# Patient Record
Sex: Female | Born: 1980 | Race: White | Hispanic: No | Marital: Married
Health system: Southern US, Community
[De-identification: ages and names within clinical notes are randomized; demographics above are authoritative.]

---

## 2006-05-26 ENCOUNTER — Other Ambulatory Visit: Admission: RE | Admit: 2006-05-26 | Discharge: 2006-05-26 | Payer: Self-pay | Admitting: Gynecology

## 2009-09-18 ENCOUNTER — Inpatient Hospital Stay (HOSPITAL_COMMUNITY): Admission: AD | Admit: 2009-09-18 | Discharge: 2009-09-22 | Payer: Self-pay | Admitting: Obstetrics and Gynecology

## 2009-09-19 ENCOUNTER — Encounter: Payer: Self-pay | Admitting: Obstetrics and Gynecology

## 2009-09-29 ENCOUNTER — Inpatient Hospital Stay (HOSPITAL_COMMUNITY): Admission: AD | Admit: 2009-09-29 | Discharge: 2009-10-02 | Payer: Self-pay | Admitting: Obstetrics & Gynecology

## 2010-09-17 IMAGING — US US OB LIMITED
1 series · 14 of 20 positions shown · non-contrast
Comparison: none

OBSTETRICAL ULTRASOUND:
 This ultrasound was performed in The [HOSPITAL], and the [facility] report will be stored to [REDACTED] PACS.  This report is also available in [HOSPITAL]?s accessANYware.

[Series 1: us ob limited · 14 of 20 slices shown]
[im 1/20]
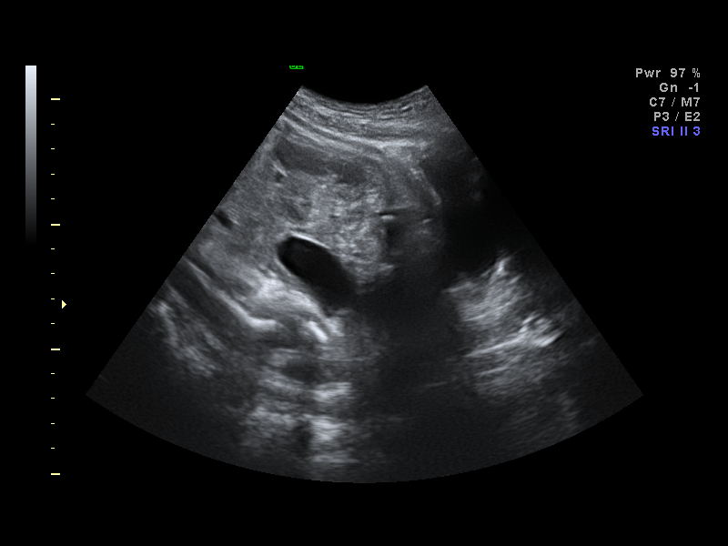
[im 3/20]
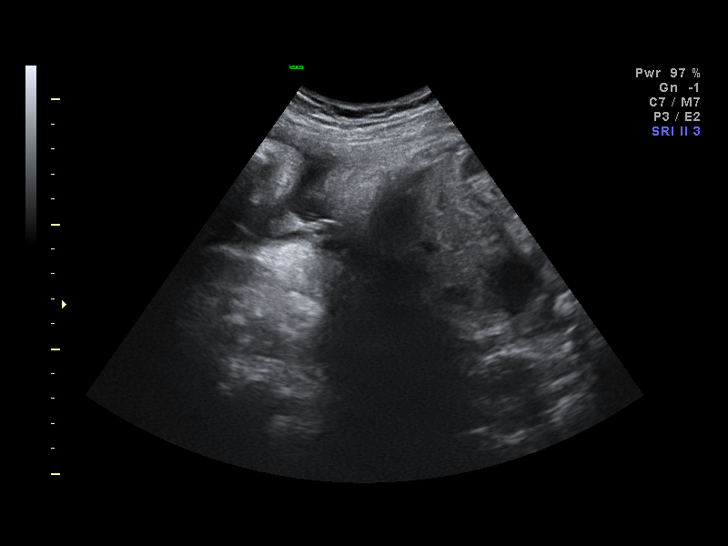
[im 4/20]
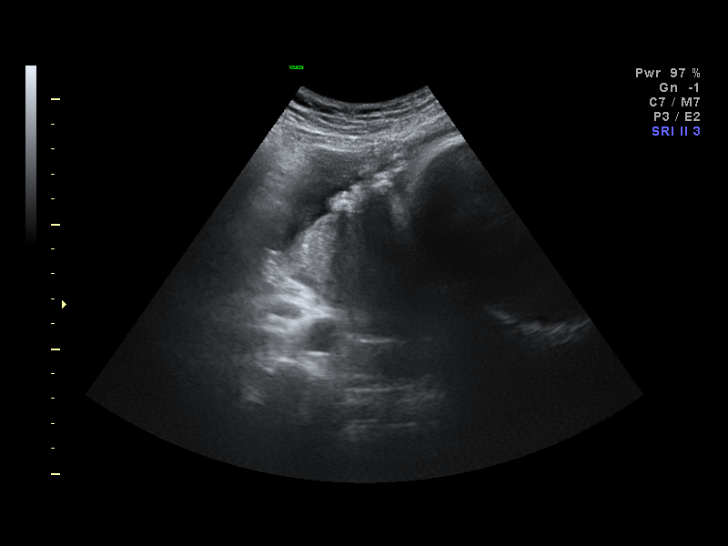
[im 6/20]
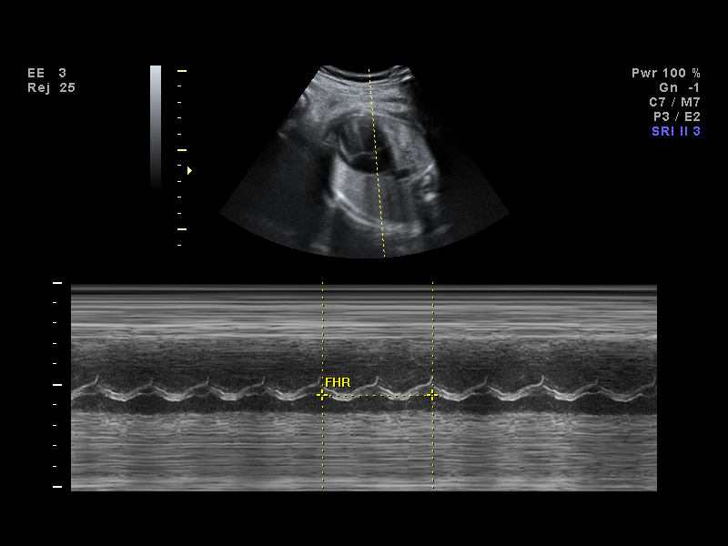
[im 7/20]
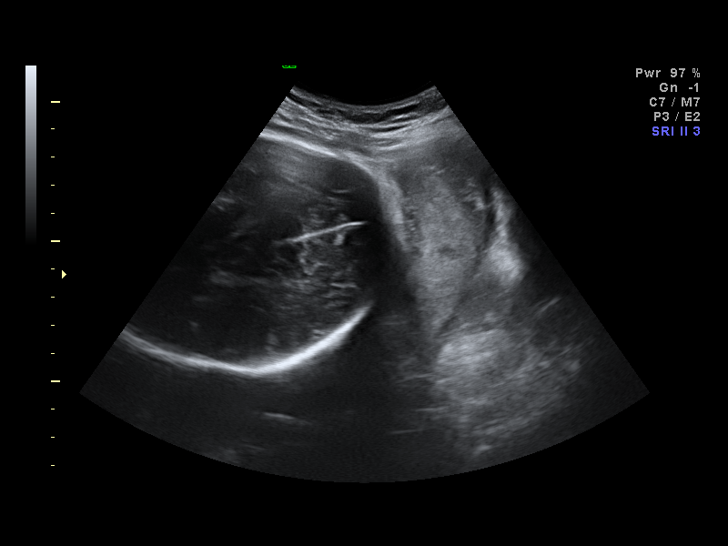
[im 8/20]
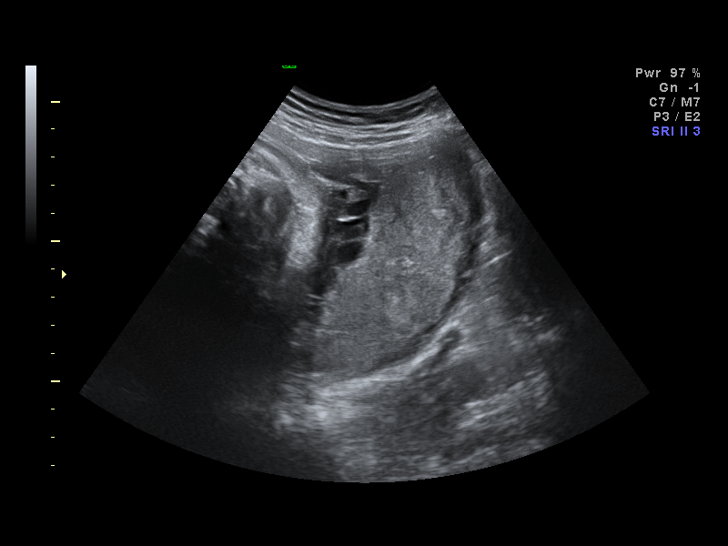
[im 10/20]
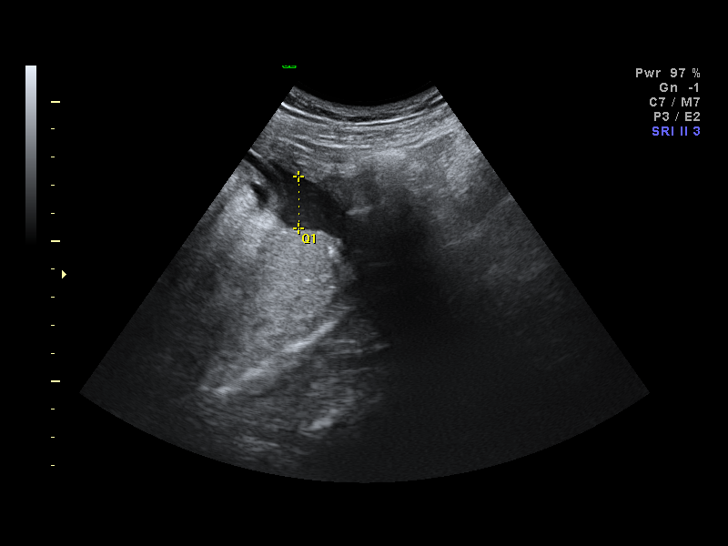
[im 11/20]
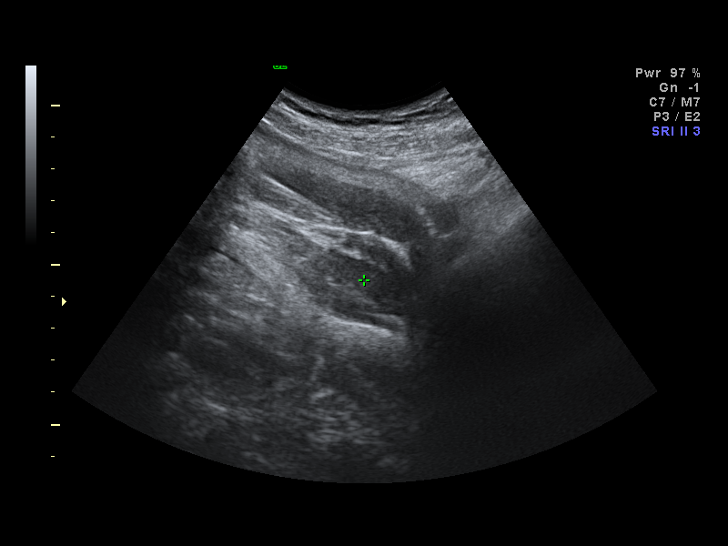
[im 13/20]
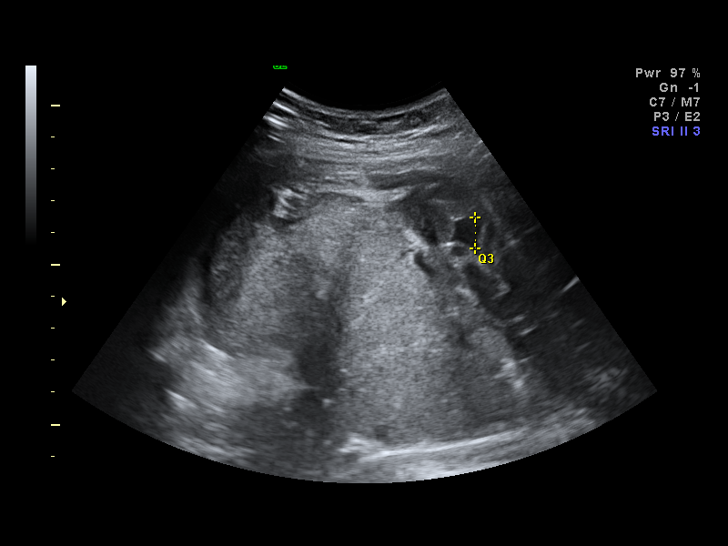
[im 14/20]
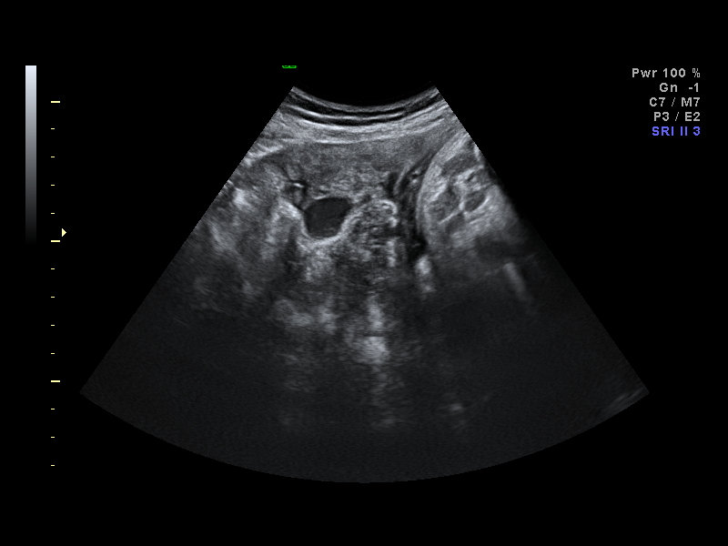
[im 16/20]
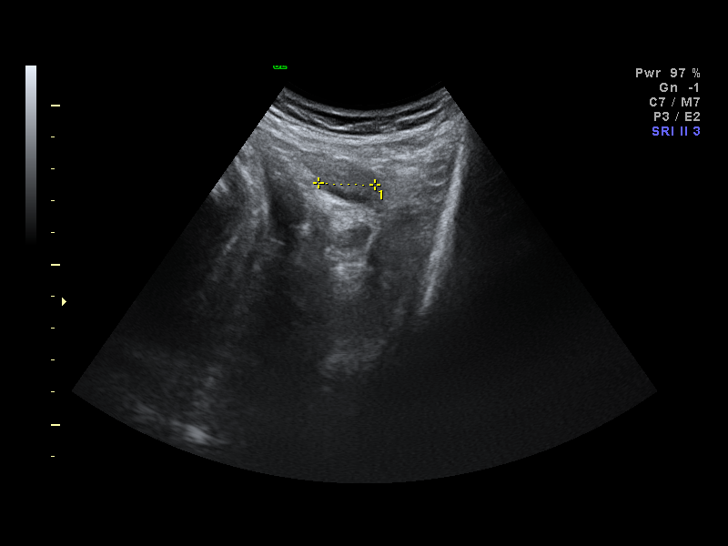
[im 17/20]
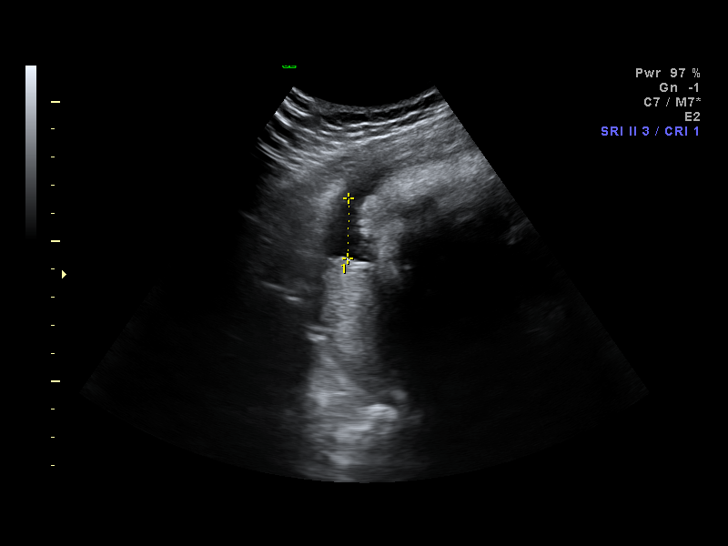
[im 18/20]
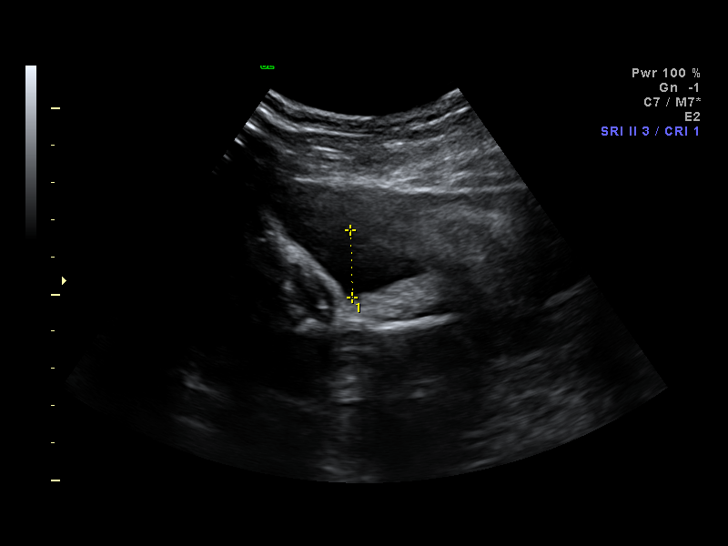
[im 20/20]
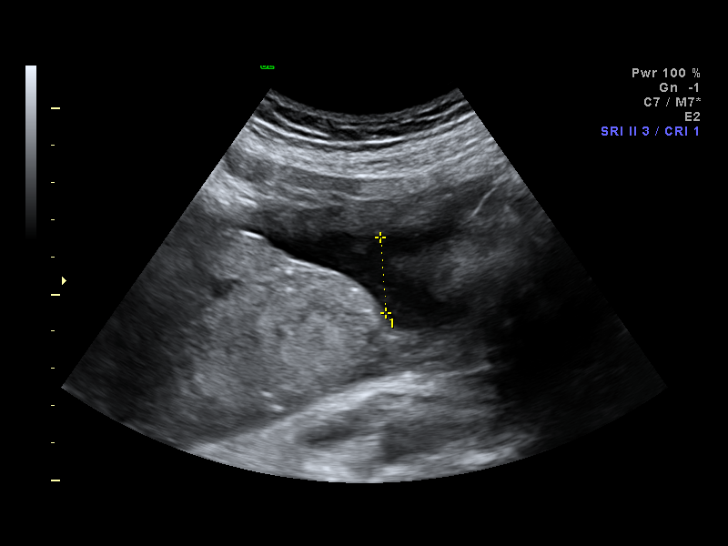

[14 of 20 positions shown; findings below may reference images not displayed]

IMPRESSION: [facility] has also been faxed to the ordering physician.

## 2010-11-29 LAB — COMPREHENSIVE METABOLIC PANEL
AST: 26 U/L (ref 0–37)
BUN: 7 mg/dL (ref 6–23)
CO2: 24 mEq/L (ref 19–32)
Calcium: 8.6 mg/dL (ref 8.4–10.5)
Chloride: 107 mEq/L (ref 96–112)
Creatinine, Ser: 0.57 mg/dL (ref 0.4–1.2)
GFR calc Af Amer: >60 mL/min (ref >60)
GFR calc non Af Amer: >60 mL/min (ref >60)
Total Bilirubin: 0.3 mg/dL (ref 0.3–1.2)

## 2010-11-29 LAB — URINALYSIS, DIPSTICK ONLY
Glucose, UA: NEGATIVE mg/dL
Nitrite: NEGATIVE
Protein, ur: NEGATIVE mg/dL
Specific Gravity, Urine: <1.005 — ABNORMAL LOW (ref 1.005–1.030)
Urobilinogen, UA: 0.2 mg/dL (ref 0.0–1.0)
pH: 6.5 (ref 5.0–8.0)

## 2010-11-29 LAB — CBC
HCT: 37.7 % (ref 36.0–46.0)
Hemoglobin: 12.7 g/dL (ref 12.0–15.0)
MCHC: 33.8 g/dL (ref 30.0–36.0)
MCV: 98.9 fL (ref 78.0–100.0)
RBC: 3.81 MIL/uL — ABNORMAL LOW (ref 3.87–5.11)

## 2010-11-29 LAB — URIC ACID: Uric Acid, Serum: 4.2 mg/dL (ref 2.4–7.0)

## 2010-11-29 LAB — LACTATE DEHYDROGENASE: LDH: 151 U/L (ref 94–250)

## 2010-11-30 LAB — CBC
HCT: 31.8 % — ABNORMAL LOW (ref 36.0–46.0)
HCT: 42.2 % (ref 36.0–46.0)
Hemoglobin: 10.9 g/dL — ABNORMAL LOW (ref 12.0–15.0)
Platelets: 173 10*3/uL (ref 150–400)
RBC: 3.22 MIL/uL — ABNORMAL LOW (ref 3.87–5.11)
RBC: 4.3 MIL/uL (ref 3.87–5.11)
RDW: 12.3 % (ref 11.5–15.5)
WBC: 9.2 10*3/uL (ref 4.0–10.5)
WBC: 9.8 10*3/uL (ref 4.0–10.5)

## 2010-11-30 LAB — COMPREHENSIVE METABOLIC PANEL
AST: 24 U/L (ref 0–37)
Albumin: 3.3 g/dL — ABNORMAL LOW (ref 3.5–5.2)
Alkaline Phosphatase: 153 U/L — ABNORMAL HIGH (ref 39–117)
BUN: 8 mg/dL (ref 6–23)
Chloride: 101 mEq/L (ref 96–112)
Creatinine, Ser: 0.65 mg/dL (ref 0.4–1.2)
Potassium: 3.8 mEq/L (ref 3.5–5.1)
Total Protein: 6.5 g/dL (ref 6.0–8.3)

## 2010-11-30 LAB — LACTATE DEHYDROGENASE: LDH: 133 U/L (ref 94–250)

## 2010-11-30 LAB — RPR: RPR Ser Ql: NONREACTIVE

## 2010-11-30 LAB — ABO/RH: ABO/RH(D): O POS

## 2010-11-30 LAB — URIC ACID: Uric Acid, Serum: 4.8 mg/dL (ref 2.4–7.0)

## 2010-11-30 LAB — TYPE AND SCREEN
ABO/RH(D): O POS
Antibody Screen: NEGATIVE

## 2017-12-23 DIAGNOSIS — B977 Papillomavirus as the cause of diseases classified elsewhere: Secondary | ICD-10-CM | POA: Diagnosis not present

## 2017-12-23 DIAGNOSIS — Z6821 Body mass index (BMI) 21.0-21.9, adult: Secondary | ICD-10-CM | POA: Diagnosis not present

## 2017-12-23 DIAGNOSIS — N72 Inflammatory disease of cervix uteri: Secondary | ICD-10-CM | POA: Diagnosis not present

## 2017-12-23 DIAGNOSIS — Z01419 Encounter for gynecological examination (general) (routine) without abnormal findings: Secondary | ICD-10-CM | POA: Diagnosis not present

## 2018-01-17 DIAGNOSIS — Z13220 Encounter for screening for lipoid disorders: Secondary | ICD-10-CM | POA: Diagnosis not present

## 2018-01-17 DIAGNOSIS — Z0000 Encounter for general adult medical examination without abnormal findings: Secondary | ICD-10-CM | POA: Diagnosis not present

## 2018-01-17 DIAGNOSIS — Z1329 Encounter for screening for other suspected endocrine disorder: Secondary | ICD-10-CM | POA: Diagnosis not present

## 2019-06-26 ENCOUNTER — Other Ambulatory Visit: Payer: Self-pay

## 2019-06-26 DIAGNOSIS — Z20822 Contact with and (suspected) exposure to covid-19: Secondary | ICD-10-CM

## 2019-06-28 LAB — NOVEL CORONAVIRUS, NAA: SARS-CoV-2, NAA: NOT DETECTED

## 2019-12-23 DIAGNOSIS — Z20828 Contact with and (suspected) exposure to other viral communicable diseases: Secondary | ICD-10-CM | POA: Diagnosis not present

## 2020-08-28 ENCOUNTER — Ambulatory Visit: Payer: Self-pay | Attending: Internal Medicine

## 2020-08-28 DIAGNOSIS — Z23 Encounter for immunization: Secondary | ICD-10-CM

## 2020-08-28 NOTE — Progress Notes (Signed)
   [facility]ov[facility] Vaccination [facility]  Name:  Megan Hanson    MRN: [id number] DOB: 10-12-1980  08/28/2020  [name] was observed post Covid-19 immunization for 15 minutes without incident. She was provided with Vaccine Information Sheet and instruction to access the V-Safe system.   [name] was instructed to call 911 with any severe reactions post vaccine: Marland Kitchen Difficulty breathing  . Swelling of face and throat  . A fast heartbeat  . A bad rash all over body  . Dizziness and weakness   Immunizations Administered    Name Date Dose VIS Date Route   Pfizer COVID-19 Vaccine 08/28/2020  3:47 PM 0.3 mL 07/02/2020 Intramuscular   Manufacturer: [employer], [employer]   Lot: [id number]   NDC: [address]-6

## 2021-06-02 DIAGNOSIS — Z01419 Encounter for gynecological examination (general) (routine) without abnormal findings: Secondary | ICD-10-CM | POA: Diagnosis not present

## 2021-06-02 DIAGNOSIS — Z975 Presence of (intrauterine) contraceptive device: Secondary | ICD-10-CM | POA: Diagnosis not present

## 2021-06-02 DIAGNOSIS — Z124 Encounter for screening for malignant neoplasm of cervix: Secondary | ICD-10-CM | POA: Diagnosis not present

## 2021-06-02 DIAGNOSIS — Z113 Encounter for screening for infections with a predominantly sexual mode of transmission: Secondary | ICD-10-CM | POA: Diagnosis not present

## 2021-06-02 DIAGNOSIS — Z01411 Encounter for gynecological examination (general) (routine) with abnormal findings: Secondary | ICD-10-CM | POA: Diagnosis not present

## 2021-06-02 DIAGNOSIS — Z6822 Body mass index (BMI) 22.0-22.9, adult: Secondary | ICD-10-CM | POA: Diagnosis not present

## 2021-12-10 ENCOUNTER — Other Ambulatory Visit (HOSPITAL_COMMUNITY): Payer: Self-pay

## 2021-12-10 MED ORDER — MEFLOQUINE HCL 250 MG PO TABS
250.0000 mg | ORAL_TABLET | ORAL | 0 refills | Status: AC
  Filled 2021-12-10 – 2022-01-11 (×2): qty 9, 63d supply, fill #0

## 2021-12-18 ENCOUNTER — Other Ambulatory Visit (HOSPITAL_COMMUNITY): Payer: Self-pay

## 2022-01-11 ENCOUNTER — Other Ambulatory Visit (HOSPITAL_COMMUNITY): Payer: Self-pay

## 2023-08-04 DIAGNOSIS — D485 Neoplasm of uncertain behavior of skin: Secondary | ICD-10-CM | POA: Diagnosis not present

## 2023-08-04 DIAGNOSIS — L578 Other skin changes due to chronic exposure to nonionizing radiation: Secondary | ICD-10-CM | POA: Diagnosis not present

## 2023-08-04 DIAGNOSIS — D229 Melanocytic nevi, unspecified: Secondary | ICD-10-CM | POA: Diagnosis not present

## 2023-08-04 DIAGNOSIS — L814 Other melanin hyperpigmentation: Secondary | ICD-10-CM | POA: Diagnosis not present

## 2023-08-04 DIAGNOSIS — D169 Benign neoplasm of bone and articular cartilage, unspecified: Secondary | ICD-10-CM | POA: Diagnosis not present

## 2023-08-04 DIAGNOSIS — D2271 Melanocytic nevi of right lower limb, including hip: Secondary | ICD-10-CM | POA: Diagnosis not present

## 2024-01-09 ENCOUNTER — Other Ambulatory Visit (HOSPITAL_COMMUNITY): Payer: Self-pay

## 2024-04-17 DIAGNOSIS — Z124 Encounter for screening for malignant neoplasm of cervix: Secondary | ICD-10-CM | POA: Diagnosis not present

## 2024-04-17 DIAGNOSIS — Z113 Encounter for screening for infections with a predominantly sexual mode of transmission: Secondary | ICD-10-CM | POA: Diagnosis not present

## 2024-04-17 DIAGNOSIS — Z1331 Encounter for screening for depression: Secondary | ICD-10-CM | POA: Diagnosis not present

## 2024-04-17 DIAGNOSIS — Z1231 Encounter for screening mammogram for malignant neoplasm of breast: Secondary | ICD-10-CM | POA: Diagnosis not present

## 2024-04-17 DIAGNOSIS — Z01419 Encounter for gynecological examination (general) (routine) without abnormal findings: Secondary | ICD-10-CM | POA: Diagnosis not present

## 2024-04-17 DIAGNOSIS — Z01411 Encounter for gynecological examination (general) (routine) with abnormal findings: Secondary | ICD-10-CM | POA: Diagnosis not present

## 2024-04-19 ENCOUNTER — Other Ambulatory Visit (HOSPITAL_COMMUNITY): Payer: Self-pay

## 2024-04-19 MED ORDER — ALPRAZOLAM 0.5 MG PO TABS
0.2500 mg | ORAL_TABLET | Freq: Every day | ORAL | 0 refills | Status: AC | PRN
  Filled 2024-04-19: qty 20, 20d supply, fill #0

## 2024-04-26 DIAGNOSIS — Z13220 Encounter for screening for lipoid disorders: Secondary | ICD-10-CM | POA: Diagnosis not present

## 2024-04-26 DIAGNOSIS — Z0000 Encounter for general adult medical examination without abnormal findings: Secondary | ICD-10-CM | POA: Diagnosis not present

## 2024-04-26 DIAGNOSIS — Z1329 Encounter for screening for other suspected endocrine disorder: Secondary | ICD-10-CM | POA: Diagnosis not present

## 2024-04-26 DIAGNOSIS — Z131 Encounter for screening for diabetes mellitus: Secondary | ICD-10-CM | POA: Diagnosis not present

## 2024-05-24 DIAGNOSIS — Z3202 Encounter for pregnancy test, result negative: Secondary | ICD-10-CM | POA: Diagnosis not present

## 2024-05-24 DIAGNOSIS — Z30433 Encounter for removal and reinsertion of intrauterine contraceptive device: Secondary | ICD-10-CM | POA: Diagnosis not present

## 2024-06-26 DIAGNOSIS — Z30431 Encounter for routine checking of intrauterine contraceptive device: Secondary | ICD-10-CM | POA: Diagnosis not present
